# Patient Record
Sex: Male | Born: 1960 | Race: White | Hispanic: No | Marital: Married | Smoking: Never smoker
Health system: Southern US, Community
[De-identification: ages and names within clinical notes are randomized; demographics above are authoritative.]

## PROBLEM LIST (undated history)

## (undated) DIAGNOSIS — Z860100 Personal history of colon polyps, unspecified: Secondary | ICD-10-CM

## (undated) DIAGNOSIS — K4090 Unilateral inguinal hernia, without obstruction or gangrene, not specified as recurrent: Secondary | ICD-10-CM

## (undated) DIAGNOSIS — K219 Gastro-esophageal reflux disease without esophagitis: Secondary | ICD-10-CM

## (undated) DIAGNOSIS — R1310 Dysphagia, unspecified: Secondary | ICD-10-CM

## (undated) DIAGNOSIS — M1990 Unspecified osteoarthritis, unspecified site: Secondary | ICD-10-CM

## (undated) DIAGNOSIS — K5903 Drug induced constipation: Secondary | ICD-10-CM

## (undated) DIAGNOSIS — E785 Hyperlipidemia, unspecified: Secondary | ICD-10-CM

## (undated) HISTORY — DX: Personal history of colonic polyps: Z86.010

## (undated) HISTORY — PX: JOINT REPLACEMENT: SHX530

## (undated) HISTORY — PX: INGUINAL HERNIA REPAIR: SUR1180

## (undated) HISTORY — PX: HERNIA REPAIR: SHX51

## (undated) HISTORY — PX: KNEE ARTHROSCOPY: SUR90

## (undated) HISTORY — DX: Personal history of colon polyps, unspecified: Z86.0100

## (undated) HISTORY — DX: Hyperlipidemia, unspecified: E78.5

## (undated) HISTORY — PX: LEG SURGERY: SHX1003

## (undated) HISTORY — DX: Dysphagia, unspecified: R13.10

## (undated) HISTORY — PX: TOTAL HIP ARTHROPLASTY: SHX124

## (undated) HISTORY — DX: Unspecified osteoarthritis, unspecified site: M19.90

---

## 2012-03-29 ENCOUNTER — Encounter: Payer: Self-pay | Admitting: Internal Medicine

## 2012-04-21 ENCOUNTER — Ambulatory Visit: Payer: Self-pay | Admitting: Internal Medicine

## 2012-04-23 ENCOUNTER — Encounter: Payer: Self-pay | Admitting: Internal Medicine

## 2012-04-26 ENCOUNTER — Ambulatory Visit (INDEPENDENT_AMBULATORY_CARE_PROVIDER_SITE_OTHER): Payer: BC Managed Care – PPO | Admitting: Internal Medicine

## 2012-04-26 ENCOUNTER — Encounter: Payer: Self-pay | Admitting: Internal Medicine

## 2012-04-26 VITALS — BP 132/68 | HR 88 | Ht 69.0 in | Wt 201.0 lb

## 2012-04-26 DIAGNOSIS — Z860100 Personal history of colon polyps, unspecified: Secondary | ICD-10-CM

## 2012-04-26 DIAGNOSIS — Z800 Family history of malignant neoplasm of digestive organs: Secondary | ICD-10-CM | POA: Insufficient documentation

## 2012-04-26 DIAGNOSIS — E785 Hyperlipidemia, unspecified: Secondary | ICD-10-CM | POA: Insufficient documentation

## 2012-04-26 DIAGNOSIS — R1310 Dysphagia, unspecified: Secondary | ICD-10-CM

## 2012-04-26 DIAGNOSIS — R1314 Dysphagia, pharyngoesophageal phase: Secondary | ICD-10-CM

## 2012-04-26 MED ORDER — PEG-KCL-NACL-NASULF-NA ASC-C 100 G PO SOLR: 1.0000 | Freq: Once | ORAL | Status: DC

## 2012-04-26 NOTE — Progress Notes (Signed)
Patient ID: Darrell Welch, male   DOB: 10-24-1961, 51 y.o.   MRN: [id number]  SUBJECTIVE: HPI Darrell Welch is a 51 yo male with PMH of HL who is seen for evaluation of high risk screening colonoscopy. The patient reports a previous colonoscopy about 5 years ago, and he recalls a history of colon polyps. He feels that some of these polyps were precancerous, but knows no cancer was found in any polyp removed. He is currently doing very well without GI complaint, other than intermittent dysphagia. He reports this is worse with dry foods such as bread, and he occasionally has trouble with meats. He's had an upper endoscopy before, but no stricture was found. He does not recall if random biopsies were performed. His issues with esophageal dysphagia are intermittent, often mild, and very long-standing. Of note, his son who is 95 years old has ongoing trouble with dysphagia (he notes his son's symptoms are more severe than his). He has never had food impaction. He has no nausea or vomiting. Has not lost weight. His appetite is good. His bowel habits been regular. He has not had any blood in his stool nor melena. No change in his bowel habits or pattern. His sister did have colon cancer at age 43 and died of this as well. No fevers or chills. He has had some left hip pain, worse at night after lying down. This is currently being evaluated by orthopedics, and he had an MRI earlier today.  Review of Systems  As per history of present illness, otherwise negative   Past Medical History  Diagnosis Date  . Hyperlipemia   . Hx of colonic polyps     Colonoscopy- Dr. Clinton Gallant)    Current Outpatient Prescriptions  Medication Sig Dispense Refill  . meloxicam (MOBIC) 15 MG tablet Uses as directed      . peg 3350 powder (MOVIPREP) 100 G SOLR Take 1 kit (100 g total) by mouth once.  1 kit  0  . simvastatin (ZOCOR) 20 MG tablet Uses as directed        No Known Allergies  Family History  Problem Relation Age  of Onset  . Colon cancer Sister 30    History  Substance Use Topics  . Smoking status: Never Smoker   . Smokeless tobacco: Never Used  . Alcohol Use: No    OBJECTIVE: BP 132/68  Pulse 88  Ht 5\' 9"  (1.753 m)  Wt 201 lb (91.173 kg)  BMI 29.68 kg/m2 Constitutional: Well-developed and well-nourished. No distress. HEENT: Normocephalic and atraumatic. Oropharynx is clear and moist. No oropharyngeal exudate. Conjunctivae are normal. Pupils are equal round and reactive to light. No scleral icterus. Neck: Neck supple. Trachea midline. Cardiovascular: Normal rate, regular rhythm and intact distal pulses. No M/R/G Pulmonary/chest: Effort normal and breath sounds normal. No wheezing, rales or rhonchi. Abdominal: Soft, nontender, nondistended. Bowel sounds active throughout. There are no masses palpable. No hepatosplenomegaly. Extremities: no clubbing, cyanosis, or edema Lymphadenopathy: No cervical adenopathy noted. Neurological: Alert and oriented to person place and time. Skin: Skin is warm and dry. No rashes noted. Psychiatric: Normal mood and affect. Behavior is normal.  ASSESSMENT AND PLAN:  51 yo male with PMH of HL who is seen for evaluation of high risk screening colonoscopy, also with intermittent, and chronic esophageal dysphagia.  1. High risk colon cancer screening, family history of colon cancer in sister at age 58/history of colon polyps -- the patient has 2 reasons to have a repeat colonoscopy for screening/surveillance,  those being his personal history of colon polyps and his sister's history of colon cancer at age 71. We will obtain the records of his prior colonoscopy for completeness. We discussed colonoscopy including the risks and benefits and he is agreeable to proceed. His screening interval should be every 5 years based on his history and his sister's history.  2. Esophageal dysphagia -- the patient has had a previous endoscopy, and we will request these records as well.  If random biopsies were not performed, then I have suggested repeating the colonoscopy to rule out eosinophilic esophagitis. The patient's symptoms could be consistent with eosinophilic esophagitis, along with the fact that his son seems to suffer from the same condition. At present he is not on PPI, but not having heartburn. We can perform the EGD at the same time as his colonoscopy, assuming random biopsies have not already been performed. Again the risks and benefits of been discussed and he is agreeable to proceed.  Further recommendations after procedures

## 2012-04-26 NOTE — Patient Instructions (Addendum)
You have been scheduled for a colonoscopy with propofol. Please follow written instructions given to you at your visit today.  Please pick up your prep kit at the pharmacy within the next 1-3 days.   We have sent the following medications to your pharmacy for you to pick up at your convenience: Moviprep

## 2012-05-19 ENCOUNTER — Telehealth: Payer: Self-pay | Admitting: Internal Medicine

## 2012-05-19 NOTE — Telephone Encounter (Signed)
Returned pts call.  Spoke with his wife.  Answered questions regarding meds for tomorrow.  She verbalized understanding.

## 2012-05-20 ENCOUNTER — Ambulatory Visit (AMBULATORY_SURGERY_CENTER): Payer: BC Managed Care – PPO | Admitting: Internal Medicine

## 2012-05-20 ENCOUNTER — Encounter: Payer: Self-pay | Admitting: Internal Medicine

## 2012-05-20 VITALS — BP 135/86 | HR 76 | Temp 96.2°F | Resp 13 | Ht 69.0 in | Wt 201.0 lb

## 2012-05-20 DIAGNOSIS — Z800 Family history of malignant neoplasm of digestive organs: Secondary | ICD-10-CM

## 2012-05-20 DIAGNOSIS — Z1211 Encounter for screening for malignant neoplasm of colon: Secondary | ICD-10-CM

## 2012-05-20 DIAGNOSIS — D126 Benign neoplasm of colon, unspecified: Secondary | ICD-10-CM

## 2012-05-20 DIAGNOSIS — R1314 Dysphagia, pharyngoesophageal phase: Secondary | ICD-10-CM

## 2012-05-20 DIAGNOSIS — Z86010 Personal history of colonic polyps: Secondary | ICD-10-CM

## 2012-05-20 DIAGNOSIS — R1310 Dysphagia, unspecified: Secondary | ICD-10-CM

## 2012-05-20 DIAGNOSIS — K209 Esophagitis, unspecified: Secondary | ICD-10-CM

## 2012-05-20 MED ORDER — PANTOPRAZOLE SODIUM 40 MG PO TBEC: 40.0000 mg | DELAYED_RELEASE_TABLET | Freq: Every day | ORAL | Status: DC

## 2012-05-20 MED ORDER — SODIUM CHLORIDE 0.9 % IV SOLN: 500.0000 mL | INTRAVENOUS | Status: DC

## 2012-05-20 NOTE — Op Note (Signed)
[facility] [address] [place], [place]  [address]  COLONOSCOPY PROCEDURE REPORT  Welch:  Darrell Welch, Darrell Welch  MR#:  [id number] BIRTHDATE:  11/11/1960, 51 yrs. old  GENDER:  male ENDOSCOPIST:  Carie Caddy. Kendricks Reap, MD REF. BY:  Keturah Barre, M.D. PROCEDURE DATE:  05/20/2012 PROCEDURE:  Colonoscopy with snare polypectomy ASA CLASS:  Class II INDICATIONS:  Elevated Risk Screening, surveillance and high-risk screening, last colonoscopy 2007 MEDICATIONS:   MAC sedation, administered by CRNA, propofol (Diprivan) 140 mg IV  DESCRIPTION OF PROCEDURE:   After Darrell risks benefits and alternatives of Darrell procedure were thoroughly explained, informed consent was obtained.  Digital rectal exam was performed and revealed no rectal masses.   Darrell LB CF-H180AL [id number] endoscope was introduced through Darrell anus and advanced to Darrell terminal ileum which was intubated for a short distance, without limitations. Darrell quality of Darrell prep was good, using MoviPrep.  Darrell instrument was then slowly withdrawn as Darrell colon was fully examined. <<PROCEDUREIMAGES>> FINDINGS:  Darrell terminal ileum appeared normal.  A 5 mm sessile polyp was found in Darrell ascending colon. Polyp was snared without cautery. Retrieval was successful. snare polyp  Two sessile polyps, 4 - 6 mm were found in Darrell transverse colon. Polyps were snared without cautery. Retrieval was successful.  Retroflexed views in Darrell rectum revealed no abnormalities.  Darrell scope was then withdrawn from Darrell cecum and Darrell procedure completed.  COMPLICATIONS:  None ENDOSCOPIC IMPRESSION: 1) Normal terminal ileum 2) Sessile polyp in Darrell ascending colon. Removed and sent to pathology. 3) Two polyps in Darrell transverse colon. Removed and sent to pathology.  RECOMMENDATIONS: 1) Await pathology results 2) If Darrell polyps removed today are proven to be adenomatous (pre-cancerous) polyps, you will need a colonoscopy in 3 years. You will receive a letter within  1-2 weeks with Darrell results of your biopsy as well as final recommendations. Please call my office if you have not received a letter after 3 weeks.  If polyps today are hyperplastic, then you will need a repeat colonoscopy in 5 years given your personal history of adenomatous (pre-cancerous) polyps.  Carie Caddy. Rhea Belton, MD  CC:  Darrell Welch Keturah Barre, MD  n. Darrell Welch at 05/20/2012 03:30 PM  Darrell Welch, [phone]

## 2012-05-20 NOTE — Progress Notes (Signed)
Patient did not experience any of the following events: a burn prior to discharge; a fall within the facility; wrong site/side/patient/procedure/implant event; or a hospital transfer or hospital admission upon discharge from the facility. (G8907) Patient did not have preoperative order for IV antibiotic SSI prophylaxis. (G8918)  

## 2012-05-20 NOTE — Op Note (Signed)
[facility] [address] [place], [place]  [address]  ENDOSCOPY PROCEDURE REPORT  PATIENT:  Mustaf, Antonacci  MR#:  [id number] BIRTHDATE:  Oct 09, 1961, 51 yrs. old  GENDER:  male ENDOSCOPIST:  Carie Caddy. Karielle Davidow, MD  PROCEDURE DATE:  05/20/2012 PROCEDURE:  EGD with biopsy, 432[address] ASA CLASS:  Class II INDICATIONS:  dysphagia MEDICATIONS:   MAC sedation, administered by CRNA, propofol (Diprivan) 150 TOPICAL ANESTHETIC:  none  DESCRIPTION OF PROCEDURE:   After the risks benefits and alternatives of the procedure were thoroughly explained, informed consent was obtained.  The LB GIF-H180 [id number] endoscope was introduced through the mouth and advanced to the second portion of the duodenum, without limitations.  The instrument was slowly withdrawn as the mucosa was fully examined. <<PROCEDUREIMAGES>> Mild LA Grade A esophagitis was found at the gastroesophageal junction.  Trachealization was noted in the total esophagus, raising question of eosinophilic esophagitis. Random biopsies were obtained and sent to pathology.  A small hiatal hernia was found. The stomach was entered and closely examined. The antrum, angularis, and lesser curvature were well visualized, including a retroflexed view of the cardia and fundus. The stomach wall was normally distensable. The scope passed easily through the pylorus into the duodenum.  The duodenal bulb was normal in appearance, as was the postbulbar duodenum.    Retroflexed views revealed a hiatal hernia.    The scope was then withdrawn from the patient and the procedure completed.  COMPLICATIONS:  None ENDOSCOPIC IMPRESSION: 1) Mild esophagitis at the gastroesophageal junction 2) Trachealization in the total esophagus.  Random biopsies performed and sent to pathology. 3) Small hiatal hernia 4) Normal stomach 5) Normal duodenum  RECOMMENDATIONS: 1) Await pathology results 2) Pantoprazole 40 mg daily for esophageal inflammation  Carie Caddy.  Rhea Belton, MD  CC:  The Patient Keturah Barre, MD  n. Rosalie DoctorCarie Caddy. Caryn Gienger at 05/20/2012 03:23 PM  Michel Harrow, [phone]

## 2012-05-20 NOTE — Patient Instructions (Addendum)

## 2012-05-21 ENCOUNTER — Telehealth: Payer: Self-pay | Admitting: *Deleted

## 2012-05-21 NOTE — Telephone Encounter (Signed)
  Follow up Call-  Call back number 05/20/2012  Post procedure Call Back phone  # [phone]  Permission to leave phone message Yes     Patient questions:  Do you have a fever, pain , or abdominal swelling? no Pain Score  0 *  Have you tolerated food without any problems? yes  Have you been able to return to your normal activities? yes  Do you have any questions about your discharge instructions: Diet   no Medications  no Follow up visit  no  Do you have questions or concerns about your Care? no  Actions: * If pain score is 4 or above: No action needed, pain <4.

## 2012-05-27 ENCOUNTER — Encounter: Payer: Self-pay | Admitting: Internal Medicine

## 2012-08-26 ENCOUNTER — Telehealth (INDEPENDENT_AMBULATORY_CARE_PROVIDER_SITE_OTHER): Payer: Self-pay

## 2012-08-26 NOTE — Telephone Encounter (Signed)
Pt has appt 09-13-12 for eval hernia. Wife states hernia will reduce and become soft when pt rest but when he is up it is firm and painful. Pt requests sooner appt. I advised her will send msg to Dr Jacinto Halim assistant to move appt up if possible. I reviewed signs of incarceration and if this occurs pt needs to go to ER asap. She states she understands.

## 2012-09-03 ENCOUNTER — Encounter (INDEPENDENT_AMBULATORY_CARE_PROVIDER_SITE_OTHER): Payer: Self-pay | Admitting: General Surgery

## 2012-09-06 ENCOUNTER — Ambulatory Visit (INDEPENDENT_AMBULATORY_CARE_PROVIDER_SITE_OTHER): Payer: BC Managed Care – PPO | Admitting: General Surgery

## 2012-09-06 ENCOUNTER — Encounter (INDEPENDENT_AMBULATORY_CARE_PROVIDER_SITE_OTHER): Payer: Self-pay | Admitting: General Surgery

## 2012-09-06 VITALS — BP 124/82 | HR 60 | Temp 98.0°F | Resp 18 | Ht 69.0 in | Wt 193.8 lb

## 2012-09-06 DIAGNOSIS — K4090 Unilateral inguinal hernia, without obstruction or gangrene, not specified as recurrent: Secondary | ICD-10-CM

## 2012-09-06 NOTE — Progress Notes (Signed)
Subjective:     Patient ID: Darrell Welch, male   DOB: August 27, 1961, 51 y.o.   MRN: [id number]  HPI We're asked to see the patient in consultation by Dr. Sherral Hammers to evaluate him for a right inguinal hernia. 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 During his rehabilitation he feels as though he has developed a hernia in his right groin. With activity he'll developed a large bulge in the right groin that reduces easily. He's had some significant tenderness on the right. He has also developed some tenderness on the left side but has not noticed a bulge. He does have a history of a laparoscopic left inguinal hernia repair with mesh and aspirate about 5 years ago. He denies any nausea or vomiting. He denies any chest pain or shortness of breath.  Review of Systems  Constitutional: Negative.   HENT: Negative.   Eyes: Negative.   Respiratory: Negative.   Cardiovascular: Negative.   Gastrointestinal: Negative.   Genitourinary: Negative.   Musculoskeletal: Negative.   Skin: Negative.   Neurological: Negative.   Hematological: Negative.   Psychiatric/Behavioral: Negative.        Objective:   Physical Exam  Constitutional: He is oriented to person, place, and time. He appears well-developed and well-nourished.  HENT:  Head: Normocephalic and atraumatic.  Eyes: Conjunctivae normal and EOM are normal. Pupils are equal, round, and reactive to light.  Neck: Normal range of motion. Neck supple.  Cardiovascular: Normal rate, regular rhythm and normal heart sounds.   Pulmonary/Chest: Effort normal and breath sounds normal.  Abdominal: Soft. Bowel sounds are normal.  Genitourinary:       The patient has significant tenderness to palpation of the right groin. At this point in time it is difficult for me to palpate any impulse with straining or reducible bulge although his history is highly suggestive of a right inguinal hernia. There is no palpable  impulse with straining or fullness in the left groin.  Musculoskeletal: Normal range of motion.  Neurological: He is alert and oriented to person, place, and time.  Skin: Skin is warm and dry.  Psychiatric: He has a normal mood and affect. His behavior is normal.       Assessment:     The patient has what sounds like a symptomatic right inguinal hernia. It is difficult to appreciate on physical exam today. Because of this we will plan to get a limited CT of the pelvis. This will also help [place] evaluate his previous repair on the left.    Plan:     If the CT is positive then we will plan for a right inguinal hernia repair with mesh.

## 2012-09-06 NOTE — Patient Instructions (Signed)
Plan for right inguinal hernia repair with mesh if ct is positive

## 2012-09-08 ENCOUNTER — Other Ambulatory Visit (INDEPENDENT_AMBULATORY_CARE_PROVIDER_SITE_OTHER): Payer: Self-pay | Admitting: General Surgery

## 2012-09-08 ENCOUNTER — Encounter (HOSPITAL_BASED_OUTPATIENT_CLINIC_OR_DEPARTMENT_OTHER): Payer: Self-pay | Admitting: *Deleted

## 2012-09-08 ENCOUNTER — Ambulatory Visit
Admission: RE | Admit: 2012-09-08 | Discharge: 2012-09-08 | Disposition: A | Discharge status: Home / Self Care | Payer: BC Managed Care – PPO | Source: Ambulatory Visit | Attending: General Surgery | Admitting: General Surgery

## 2012-09-08 DIAGNOSIS — K4090 Unilateral inguinal hernia, without obstruction or gangrene, not specified as recurrent: Secondary | ICD-10-CM

## 2012-09-08 MED ORDER — IOHEXOL 300 MG/ML  SOLN
100.0000 mL | Freq: Once | INTRAMUSCULAR | Status: AC | PRN
  Administered 2012-09-08: 100 mL via INTRAVENOUS

## 2012-09-08 NOTE — Progress Notes (Signed)
tammy talked with pt while computers were down

## 2012-09-09 ENCOUNTER — Telehealth (INDEPENDENT_AMBULATORY_CARE_PROVIDER_SITE_OTHER): Payer: Self-pay | Admitting: General Surgery

## 2012-09-09 NOTE — Telephone Encounter (Signed)
Message copied by Liliana Cline on Thu Sep 09, 2012  9:22 AM ------      Message from: Caleen Essex III      Created: Wed Sep 08, 2012  6:16 PM       He has right inguinal hernia. Can't see left very well. i will put orders in for the right

## 2012-09-09 NOTE — Telephone Encounter (Signed)
Patient aware the CT showed hernia on the right. He is set up for surgery tomorrow.

## 2012-09-10 ENCOUNTER — Ambulatory Visit (HOSPITAL_BASED_OUTPATIENT_CLINIC_OR_DEPARTMENT_OTHER): Payer: BC Managed Care – PPO | Admitting: Anesthesiology

## 2012-09-10 ENCOUNTER — Ambulatory Visit (HOSPITAL_BASED_OUTPATIENT_CLINIC_OR_DEPARTMENT_OTHER)
Admission: RE | Admit: 2012-09-10 | Discharge: 2012-09-10 | Disposition: A | Discharge status: Home / Self Care | Payer: BC Managed Care – PPO | Source: Ambulatory Visit | Attending: General Surgery | Admitting: General Surgery

## 2012-09-10 ENCOUNTER — Encounter (HOSPITAL_BASED_OUTPATIENT_CLINIC_OR_DEPARTMENT_OTHER): Payer: Self-pay | Admitting: Anesthesiology

## 2012-09-10 ENCOUNTER — Encounter (HOSPITAL_BASED_OUTPATIENT_CLINIC_OR_DEPARTMENT_OTHER)
Admission: RE | Disposition: A | Discharge status: Home / Self Care | Payer: Self-pay | Source: Ambulatory Visit | Attending: General Surgery

## 2012-09-10 ENCOUNTER — Encounter (HOSPITAL_BASED_OUTPATIENT_CLINIC_OR_DEPARTMENT_OTHER): Payer: Self-pay | Admitting: *Deleted

## 2012-09-10 DIAGNOSIS — K4090 Unilateral inguinal hernia, without obstruction or gangrene, not specified as recurrent: Secondary | ICD-10-CM | POA: Insufficient documentation

## 2012-09-10 DIAGNOSIS — K219 Gastro-esophageal reflux disease without esophagitis: Secondary | ICD-10-CM | POA: Insufficient documentation

## 2012-09-10 HISTORY — PX: INSERTION OF MESH: SHX5868

## 2012-09-10 HISTORY — PX: INGUINAL HERNIA REPAIR: SHX194

## 2012-09-10 HISTORY — DX: Gastro-esophageal reflux disease without esophagitis: K21.9

## 2012-09-10 HISTORY — DX: Unilateral inguinal hernia, without obstruction or gangrene, not specified as recurrent: K40.90

## 2012-09-10 HISTORY — DX: Drug induced constipation: K59.03

## 2012-09-10 SURGERY — REPAIR, HERNIA, INGUINAL, ADULT
Anesthesia: General | Site: Groin | Laterality: Right | Wound class: Clean

## 2012-09-10 MED ORDER — LACTATED RINGERS IV SOLN
INTRAVENOUS | Status: DC
  Administered 2012-09-10: 11:00:00 via INTRAVENOUS
  Administered 2012-09-10: 10 mL/h via INTRAVENOUS
  Administered 2012-09-10: 10:00:00 via INTRAVENOUS

## 2012-09-10 MED ORDER — HYDROMORPHONE HCL PF 1 MG/ML IJ SOLN
0.2500 mg | INTRAMUSCULAR | Status: DC | PRN
  Administered 2012-09-10 (×2): 0.5 mg via INTRAVENOUS

## 2012-09-10 MED ORDER — MIDAZOLAM HCL 5 MG/5ML IJ SOLN
INTRAMUSCULAR | Status: DC | PRN
  Administered 2012-09-10: 2 mg via INTRAVENOUS

## 2012-09-10 MED ORDER — BUPIVACAINE-EPINEPHRINE 0.25% -1:200000 IJ SOLN
INTRAMUSCULAR | Status: DC | PRN
  Administered 2012-09-10: 30 mL

## 2012-09-10 MED ORDER — OXYCODONE HCL 5 MG PO TABS: 5.0000 mg | ORAL_TABLET | Freq: Once | ORAL | Status: DC | PRN

## 2012-09-10 MED ORDER — CHLORHEXIDINE GLUCONATE 4 % EX LIQD: 1.0000 "application " | Freq: Once | CUTANEOUS | Status: DC

## 2012-09-10 MED ORDER — DEXAMETHASONE SODIUM PHOSPHATE 4 MG/ML IJ SOLN
INTRAMUSCULAR | Status: DC | PRN
  Administered 2012-09-10: 10 mg via INTRAVENOUS

## 2012-09-10 MED ORDER — CEFAZOLIN SODIUM-DEXTROSE 2-3 GM-% IV SOLR: 2.0000 g | INTRAVENOUS | Status: DC

## 2012-09-10 MED ORDER — FENTANYL CITRATE 0.05 MG/ML IJ SOLN
INTRAMUSCULAR | Status: DC | PRN
  Administered 2012-09-10 (×4): 50 ug via INTRAVENOUS

## 2012-09-10 MED ORDER — PROPOFOL 10 MG/ML IV BOLUS
INTRAVENOUS | Status: DC | PRN
  Administered 2012-09-10: 200 mg via INTRAVENOUS

## 2012-09-10 MED ORDER — PROMETHAZINE HCL 25 MG/ML IJ SOLN: 6.2500 mg | INTRAMUSCULAR | Status: DC | PRN

## 2012-09-10 MED ORDER — CEFAZOLIN SODIUM-DEXTROSE 2-3 GM-% IV SOLR
2.0000 g | INTRAVENOUS | Status: AC
  Administered 2012-09-10: 2 g via INTRAVENOUS

## 2012-09-10 MED ORDER — HYDROCODONE-ACETAMINOPHEN 5-325 MG PO TABS: 1.0000 | ORAL_TABLET | Freq: Four times a day (QID) | ORAL | Status: DC | PRN

## 2012-09-10 MED ORDER — ONDANSETRON HCL 4 MG/2ML IJ SOLN
INTRAMUSCULAR | Status: DC | PRN
  Administered 2012-09-10: 4 mg via INTRAVENOUS

## 2012-09-10 MED ORDER — LIDOCAINE HCL (CARDIAC) 20 MG/ML IV SOLN
INTRAVENOUS | Status: DC | PRN
  Administered 2012-09-10: 100 mg via INTRAVENOUS

## 2012-09-10 MED ORDER — OXYCODONE HCL 5 MG/5ML PO SOLN: 5.0000 mg | Freq: Once | ORAL | Status: DC | PRN

## 2012-09-10 SURGICAL SUPPLY — 45 items
BENZOIN TINCTURE PRP APPL 2/3 (GAUZE/BANDAGES/DRESSINGS) IMPLANT
BLADE HEX COATED 2.75 (ELECTRODE) ×2 IMPLANT
BLADE SURG 15 STRL LF DISP TIS (BLADE) ×1 IMPLANT
BLADE SURG 15 STRL SS (BLADE) ×1
BLADE SURG ROTATE 9660 (MISCELLANEOUS) ×2 IMPLANT
CHLORAPREP W/TINT 26ML (MISCELLANEOUS) ×2 IMPLANT
CLOTH BEACON ORANGE TIMEOUT ST (SAFETY) ×2 IMPLANT
COVER MAYO STAND STRL (DRAPES) ×2 IMPLANT
COVER TABLE BACK 60X90 (DRAPES) ×2 IMPLANT
DECANTER SPIKE VIAL GLASS SM (MISCELLANEOUS) IMPLANT
DERMABOND ADVANCED (GAUZE/BANDAGES/DRESSINGS) ×1
DERMABOND ADVANCED .7 DNX12 (GAUZE/BANDAGES/DRESSINGS) ×1 IMPLANT
DRAIN PENROSE 1/2X12 LTX STRL (WOUND CARE) ×2 IMPLANT
DRAPE LAPAROTOMY TRNSV 102X78 (DRAPE) ×2 IMPLANT
DRAPE UTILITY XL STRL (DRAPES) ×2 IMPLANT
ELECT REM PT RETURN 9FT ADLT (ELECTROSURGICAL) ×2
ELECTRODE REM PT RTRN 9FT ADLT (ELECTROSURGICAL) ×1 IMPLANT
GLOVE BIO SURGEON STRL SZ7.5 (GLOVE) ×2 IMPLANT
GLOVE ECLIPSE 6.5 STRL STRAW (GLOVE) ×2 IMPLANT
GOWN PREVENTION PLUS XLARGE (GOWN DISPOSABLE) ×4 IMPLANT
MESH ULTRAPRO 3X6 7.6X15CM (Mesh General) ×2 IMPLANT
NEEDLE HYPO 22GX1.5 SAFETY (NEEDLE) IMPLANT
NEEDLE HYPO 25X1 1.5 SAFETY (NEEDLE) ×2 IMPLANT
NS IRRIG 1000ML POUR BTL (IV SOLUTION) ×2 IMPLANT
PACK BASIN DAY SURGERY FS (CUSTOM PROCEDURE TRAY) ×2 IMPLANT
PAIN PUMP ON-Q 100MLX2ML 2.5IN (PAIN MANAGEMENT) IMPLANT
PENCIL BUTTON HOLSTER BLD 10FT (ELECTRODE) ×2 IMPLANT
SLEEVE SCD COMPRESS KNEE MED (MISCELLANEOUS) ×2 IMPLANT
SPONGE LAP 18X18 X RAY DECT (DISPOSABLE) ×2 IMPLANT
STRIP CLOSURE SKIN 1/2X4 (GAUZE/BANDAGES/DRESSINGS) IMPLANT
SUT MON AB 4-0 PC3 18 (SUTURE) ×2 IMPLANT
SUT PROLENE 2 0 SH DA (SUTURE) ×4 IMPLANT
SUT SILK 2 0 SH (SUTURE) ×2 IMPLANT
SUT SILK 3 0 SH 30 (SUTURE) IMPLANT
SUT SILK 3 0 TIES 17X18 (SUTURE) ×1
SUT SILK 3-0 18XBRD TIE BLK (SUTURE) ×1 IMPLANT
SUT VIC AB 0 SH 27 (SUTURE) ×2 IMPLANT
SUT VIC AB 2-0 SH 27 (SUTURE) ×1
SUT VIC AB 2-0 SH 27XBRD (SUTURE) ×1 IMPLANT
SUT VIC AB 3-0 SH 27 (SUTURE) ×1
SUT VIC AB 3-0 SH 27X BRD (SUTURE) ×1 IMPLANT
SYR CONTROL 10ML LL (SYRINGE) ×2 IMPLANT
TOWEL OR 17X24 6PK STRL BLUE (TOWEL DISPOSABLE) ×2 IMPLANT
TOWEL OR NON WOVEN STRL DISP B (DISPOSABLE) IMPLANT
WATER STERILE IRR 1000ML POUR (IV SOLUTION) IMPLANT

## 2012-09-10 NOTE — Transfer of Care (Signed)
Immediate Anesthesia Transfer of Care Note  Patient: Darrell Welch  Procedure(s) Performed: Procedure(s) (LRB) with comments: HERNIA REPAIR INGUINAL ADULT (Right) INSERTION OF MESH (Right) - right inguinal hernia repair  Patient Location: PACU  Anesthesia Type:General  Level of Consciousness: sedated  Airway & Oxygen Therapy: Patient Spontanous Breathing and Patient connected to face mask oxygen  Post-op Assessment: Report given to PACU RN and Post -op Vital signs reviewed and stable  Post vital signs: Reviewed and stable  Complications: No apparent anesthesia complications

## 2012-09-10 NOTE — Op Note (Signed)
09/10/2012  11:47 AM  PATIENT:  Darrell Welch  51 y.o. male  PRE-OPERATIVE DIAGNOSIS:  right inguinal hernia  POST-OPERATIVE DIAGNOSIS:  right inguinal hernia  PROCEDURE:  Procedure(s) (LRB) with comments: HERNIA REPAIR INGUINAL ADULT (Right) INSERTION OF MESH (Right) - right inguinal hernia repair  SURGEON:  Surgeon(s) and Role:    * Robyne Askew, MD - Primary  PHYSICIAN ASSISTANT:   ASSISTANTS: none   ANESTHESIA:   general  EBL:  Total I/O In: 1000 [I.V.:1000] Out: -   BLOOD ADMINISTERED:none  DRAINS: none   LOCAL MEDICATIONS USED:  MARCAINE     SPECIMEN:  No Specimen  DISPOSITION OF SPECIMEN:  N/A  COUNTS:  YES  TOURNIQUET:  * No tourniquets in log *  DICTATION: .Dragon Dictation After informed consent was obtained the patient was brought to the operating room and placed in the supine position on the operating room table. After adequate induction of general anesthesia the patient's abdomen and right groin were prepped with ChloraPrep, allowed to dry, and draped in usual sterile manner. The right groin area was then infiltrated with quarter percent Marcaine. A small incision was made from the edge of the pubic tubercle on the right towards the anterior superior iliac spine. This incision was carried through the skin and subcutaneous tissue sharply with the electrocautery until the fascia of the external oblique was encountered. A small bridging vein was clamped with hemostats divided and ligated with 3-0 silk ties. The external oblique fascia was opened along its fibers towards the apex of the external ring with a 15 blade knife and Metzenbaum scissors. A wheatlan retractor was deployed. Blunt dissection was carried out of the cord structures until they could be surrounded between 2 fingers. A half-inch Penrose drain was placed around the cord structures for retraction purposes. The ilioinguinal nerve was identified and vault with some scar tissue. It was dissected free  proximally and distally clamped with hemostats, and divided and ligated with 3-0 silk ties. The cord structures were then gently skeletonized by combination of blunt hemostat dissection and some sharp dissection with the electrocautery. A small hernia sac was identified. It was gently separated from the rest of the cord structures. Care was taken to preserve the testicular artery and vas deferens. The sac was so small that it reduced easily. We repaired the floor of the canal with an interrupted 0 Vicryl stitch. A 3 x 6 piece of ultra pro mesh was then chosen and cut to fit. The mesh was sewed inferiorly to the shelving edge of the inguinal ligament with a running 2-0 Prolene stitch. Tails were cut in the mesh laterally and the tails were wrapped around the cord structures. Superiorly the mesh was sewed to the musculoaponeurotic strength layer of the transversalis with 2-0 Prolene vertical mattress stitches. Lateral to the cord the tails of the mesh were anchored to the shelving edge of the inguinal ligament with an interrupted 2-0 Prolene stitch. Once this was accomplished the mesh appeared to be in good position and the hernia appeared to be well repaired. The wound was irrigated with copious amounts of saline. The external oblique fascia was reapproximated with a running 2-0 Vicryl stitch. The wound was then infiltrated with 1/4% Marcaine. The subcutaneous fascia was closed with a running 3-0 Vicryl stitch. The skin was then closed around and 4 Monocryl subcuticular stitch. Dermabond dressings were applied. The patient tolerated the procedure well. At the end of the case needle sponge and instrument counts were  correct. The patient was then awakened and taken to recovery in stable condition. The patient's testicles in the scrotum at the end of the case.  PLAN OF CARE: Discharge to home after PACU  PATIENT DISPOSITION:  PACU - hemodynamically stable.   Delay start of Pharmacological VTE agent (>24hrs) due to  surgical blood loss or risk of bleeding: not applicable

## 2012-09-10 NOTE — Interval H&P Note (Signed)
History and Physical Interval Note:  09/10/2012 10:01 AM  Darrell Welch Darrell Welch  has presented today for surgery, with the diagnosis of right inguinal hernia  The various methods of treatment have been discussed with the patient and family. After consideration of risks, benefits and other options for treatment, the patient has consented to  Procedure(s) (LRB) with comments: HERNIA REPAIR INGUINAL ADULT (Right) INSERTION OF MESH (N/A) as a surgical intervention .  The patient's history has been reviewed, patient examined, no change in status, stable for surgery.  I have reviewed the patient's chart and labs.  Questions were answered to the patient's satisfaction.     TOTH III,Laporche Martelle S

## 2012-09-10 NOTE — Anesthesia Postprocedure Evaluation (Signed)
  Anesthesia Post-op Note  Patient: Darrell Welch  Procedure(s) Performed: Procedure(s) (LRB) with comments: HERNIA REPAIR INGUINAL ADULT (Right) INSERTION OF MESH (Right) - right inguinal hernia repair  Patient Location: PACU  Anesthesia Type:General  Level of Consciousness: awake  Airway and Oxygen Therapy: Patient Spontanous Breathing  Post-op Pain: mild  Post-op Assessment: Post-op Vital signs reviewed, Patient's Cardiovascular Status Stable, Respiratory Function Stable, Patent Airway, No signs of Nausea or vomiting and Pain level controlled  Post-op Vital Signs: stable  Complications: No apparent anesthesia complications

## 2012-09-10 NOTE — Anesthesia Procedure Notes (Signed)
Procedure Name: LMA Insertion Date/Time: 09/10/2012 10:30 AM Performed by: Gar Gibbon Pre-anesthesia Checklist: Patient identified, Emergency Drugs available, Suction available and Patient being monitored Patient Re-evaluated:Patient Re-evaluated prior to inductionOxygen Delivery Method: Circle System Utilized Preoxygenation: Pre-oxygenation with 100% oxygen Intubation Type: IV induction Ventilation: Mask ventilation without difficulty LMA: LMA inserted LMA Size: 5.0 Number of attempts: 1 Airway Equipment and Method: bite block Placement Confirmation: positive ETCO2 Tube secured with: Tape Dental Injury: Teeth and Oropharynx as per pre-operative assessment

## 2012-09-10 NOTE — Anesthesia Preprocedure Evaluation (Signed)
Anesthesia Evaluation  Patient identified by MRN, date of birth, ID band Patient awake    Reviewed: Allergy & Precautions, H&P , NPO status , Patient's Chart, lab work & pertinent test results  History of Anesthesia Complications Negative for: history of anesthetic complications  Airway Mallampati: II TM Distance: >3 FB Neck ROM: Full    Dental  (+) Teeth Intact and Dental Advisory Given   Pulmonary neg pulmonary ROS,    Pulmonary exam normal       Cardiovascular negative cardio ROS      Neuro/Psych negative neurological ROS     GI/Hepatic Neg liver ROS, GERD-  Controlled,  Endo/Other  negative endocrine ROS  Renal/GU negative Renal ROS     Musculoskeletal   Abdominal   Peds  Hematology   Anesthesia Other Findings   Reproductive/Obstetrics                           Anesthesia Physical Anesthesia Plan  ASA: I  Anesthesia Plan: General   Post-op Pain Management:    Induction: Intravenous  Airway Management Planned: LMA  Additional Equipment:   Intra-op Plan:   Post-operative Plan: Extubation in OR  Informed Consent: I have reviewed the patients History and Physical, chart, labs and discussed the procedure including the risks, benefits and alternatives for the proposed anesthesia with the patient or authorized representative who has indicated his/her understanding and acceptance.   Dental advisory given  Plan Discussed with: CRNA, Anesthesiologist and Surgeon  Anesthesia Plan Comments:         Anesthesia Quick Evaluation

## 2012-09-10 NOTE — H&P (View-Only) (Signed)
Subjective:     Patient ID: Darrell Welch, male   DOB: 08/03/1961, 51 y.o.   MRN: [id number]  HPI We're asked to see the patient in consultation by Dr. Robbins to evaluate him for a right inguinal hernia. 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 During his rehabilitation he feels as though he has developed a hernia in his right groin. With activity he'll developed a large bulge in the right groin that reduces easily. He's had some significant tenderness on the right. He has also developed some tenderness on the left side but has not noticed a bulge. He does have a history of a laparoscopic left inguinal hernia repair with mesh and aspirate about 5 years ago. He denies any nausea or vomiting. He denies any chest pain or shortness of breath.  Review of Systems  Constitutional: Negative.   HENT: Negative.   Eyes: Negative.   Respiratory: Negative.   Cardiovascular: Negative.   Gastrointestinal: Negative.   Genitourinary: Negative.   Musculoskeletal: Negative.   Skin: Negative.   Neurological: Negative.   Hematological: Negative.   Psychiatric/Behavioral: Negative.        Objective:   Physical Exam  Constitutional: He is oriented to person, place, and time. He appears well-developed and well-nourished.  HENT:  Head: Normocephalic and atraumatic.  Eyes: Conjunctivae normal and EOM are normal. Pupils are equal, round, and reactive to light.  Neck: Normal range of motion. Neck supple.  Cardiovascular: Normal rate, regular rhythm and normal heart sounds.   Pulmonary/Chest: Effort normal and breath sounds normal.  Abdominal: Soft. Bowel sounds are normal.  Genitourinary:       The patient has significant tenderness to palpation of the right groin. At this point in time it is difficult for me to palpate any impulse with straining or reducible bulge although his history is highly suggestive of a right inguinal hernia. There is no palpable  impulse with straining or fullness in the left groin.  Musculoskeletal: Normal range of motion.  Neurological: He is alert and oriented to person, place, and time.  Skin: Skin is warm and dry.  Psychiatric: He has a normal mood and affect. His behavior is normal.       Assessment:     The patient has what sounds like a symptomatic right inguinal hernia. It is difficult to appreciate on physical exam today. Because of this we will plan to get a limited CT of the pelvis. This will also help us evaluate his previous repair on the left.    Plan:     If the CT is positive then we will plan for a right inguinal hernia repair with mesh.      

## 2012-09-13 ENCOUNTER — Ambulatory Visit (INDEPENDENT_AMBULATORY_CARE_PROVIDER_SITE_OTHER): Payer: BC Managed Care – PPO | Admitting: General Surgery

## 2012-09-13 ENCOUNTER — Encounter (HOSPITAL_BASED_OUTPATIENT_CLINIC_OR_DEPARTMENT_OTHER): Payer: Self-pay | Admitting: General Surgery

## 2012-09-30 ENCOUNTER — Ambulatory Visit (INDEPENDENT_AMBULATORY_CARE_PROVIDER_SITE_OTHER): Payer: BC Managed Care – PPO | Admitting: General Surgery

## 2012-09-30 ENCOUNTER — Encounter (INDEPENDENT_AMBULATORY_CARE_PROVIDER_SITE_OTHER): Payer: Self-pay | Admitting: General Surgery

## 2012-09-30 VITALS — BP 130/74 | HR 86 | Temp 97.6°F | Resp 18 | Ht 69.0 in | Wt 198.8 lb

## 2012-09-30 DIAGNOSIS — K4090 Unilateral inguinal hernia, without obstruction or gangrene, not specified as recurrent: Secondary | ICD-10-CM

## 2012-09-30 NOTE — Progress Notes (Signed)
Subjective:     Patient ID: FABIANO GINLEY, male   DOB: 1961/05/21, 51 y.o.   MRN: [id number]  HPI The patient is a 51 year old white male who is 3 weeks status post right inguinal hernia repair with mesh. His discomfort is gradually improving. His appetite is good his bowels are working normally.  Review of Systems     Objective:   Physical Exam On exam his abdomen is soft and nontender. His right groin incision is healing nicely with no sign of infection. He has a normal healing ridge. There is no palpable evidence for recurrence of the hernia.    Assessment:     3 weeks status post right inguinal hernia repair with mesh    Plan:     At this point I would like him to continue to refrain from lifting anything heavy. We will plan to see him back in 3 weeks to check his progress.

## 2012-09-30 NOTE — Patient Instructions (Signed)
No heavy lifting for another 3 weeks 

## 2012-10-19 ENCOUNTER — Encounter (INDEPENDENT_AMBULATORY_CARE_PROVIDER_SITE_OTHER): Payer: Self-pay | Admitting: General Surgery

## 2012-10-19 ENCOUNTER — Ambulatory Visit (INDEPENDENT_AMBULATORY_CARE_PROVIDER_SITE_OTHER): Payer: BC Managed Care – PPO | Admitting: General Surgery

## 2012-10-19 VITALS — BP 118/70 | HR 80 | Temp 97.5°F | Resp 16 | Ht 69.0 in | Wt 204.6 lb

## 2012-10-19 DIAGNOSIS — K4090 Unilateral inguinal hernia, without obstruction or gangrene, not specified as recurrent: Secondary | ICD-10-CM

## 2012-10-19 NOTE — Progress Notes (Signed)
Subjective:     Patient ID: Darrell Welch, male   DOB: 1961/04/16, 51 y.o.   MRN: [id number]  HPI The patient is a 51 year old white male who is about 6 weeks status post right inguinal hernia repair with mesh. He still has some minor soreness associated with it but overall feels well. His appetite is good and his bowels are working normally.  Review of Systems     Objective:   Physical Exam On exam his abdomen is soft and nontender. His right groin incision is healing nicely with no sign of infection. There is no palpable evidence for recurrence of the hernia.    Assessment:     6 weeks status post right inguinal hernia repair with mesh    Plan:     At this point I believe he can return to his normal activities without any restrictions. We will plan to see him back on a when necessary basis

## 2012-10-19 NOTE — Patient Instructions (Signed)
May return to normal activities without restriction 

## 2013-03-17 IMAGING — CT CT PELVIS W/ CM
3 series · 13 of 36 positions shown, 19 images · IV contrast (READICAT & [ID] OMNI 300)
Comparison: Plain film of 07/13/2012.  MRI of the hip of
04/26/2012.

CLINICAL DATA: Bilateral inguinal pain, right greater than left.
Symptoms for 4 weeks.  Rule out inguinal hernias.

CT  PELVIS WITH CONTRAST
TECHNIQUE: Multidetector CT imaging of the pelvis was performed
following the standard protocol following the bolus administration
of intravenous contrast.
Contrast: 100  ml [id number]

[Series 3: routine pelvis · axial · 0.70mm/px · z∈[-212,-52]mm · 6 of 46 slices shown, 11 images]
[im 7/46  soft-tissue]
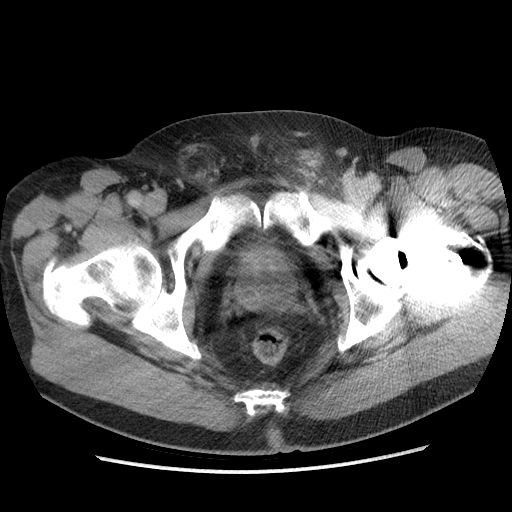
[im 7/46  bone]
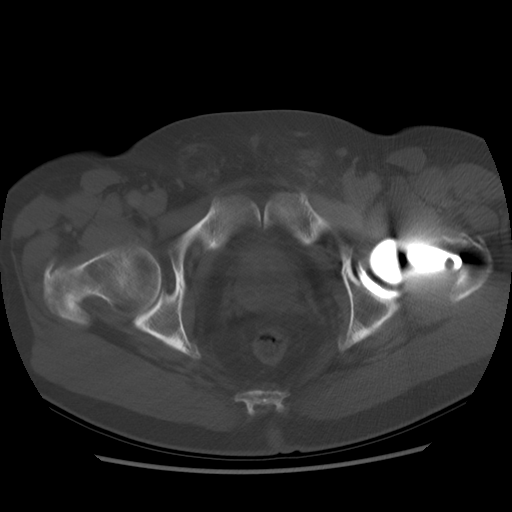
[im 13/46  soft-tissue]
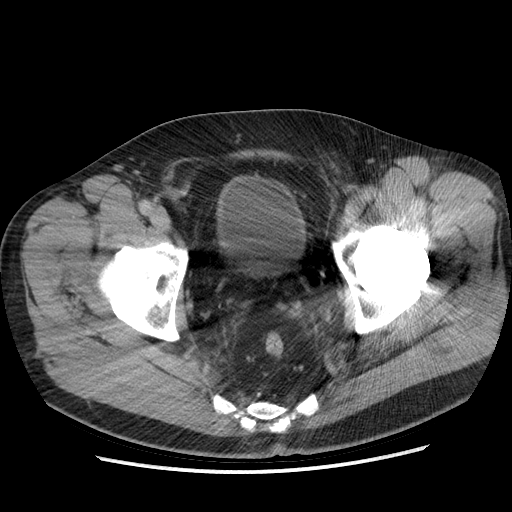
[im 20/46  soft-tissue]
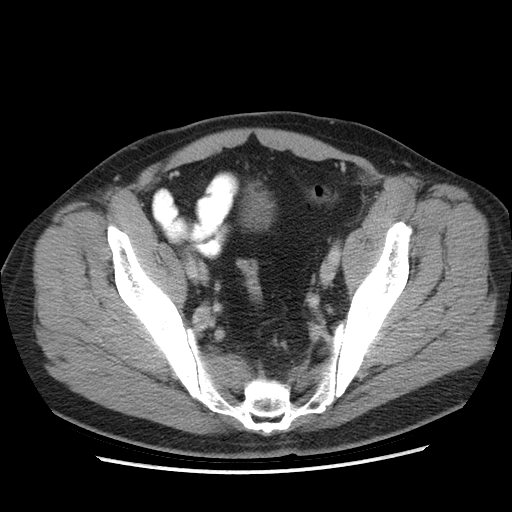
[im 20/46  lung]
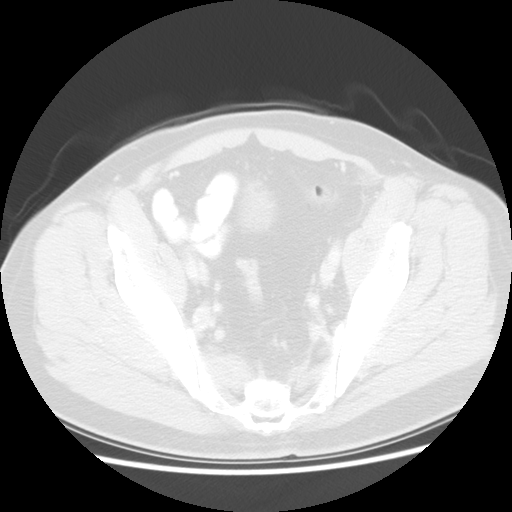
[im 26/46  soft-tissue]
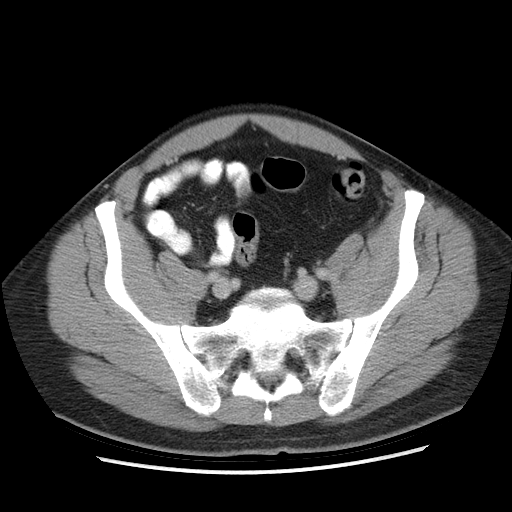
[im 26/46  lung]
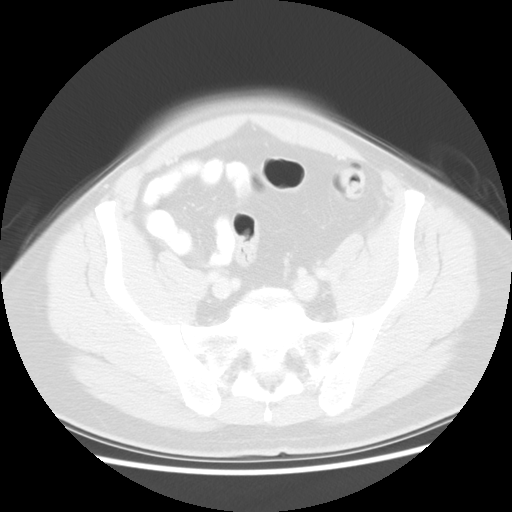
[im 33/46  soft-tissue]
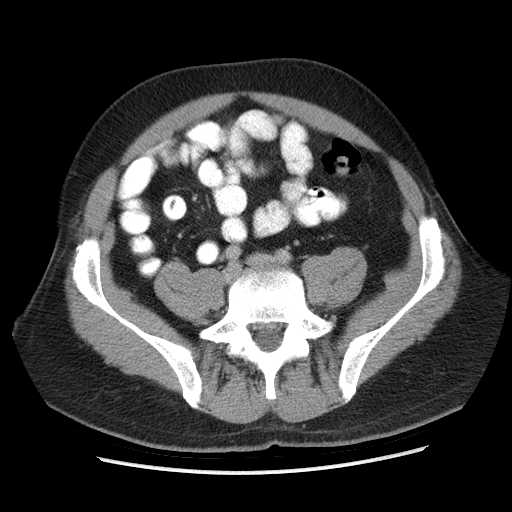
[im 33/46  lung]
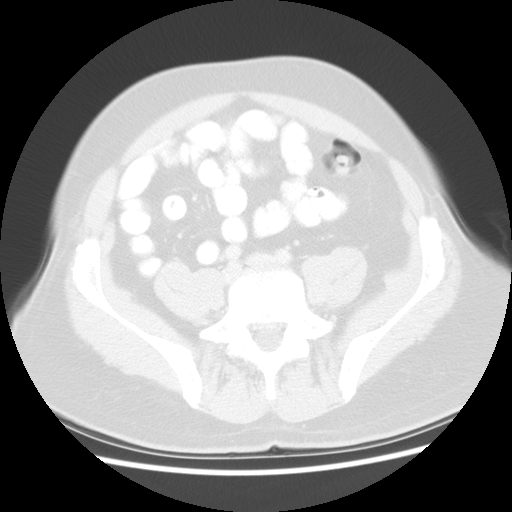
[im 39/46  soft-tissue]
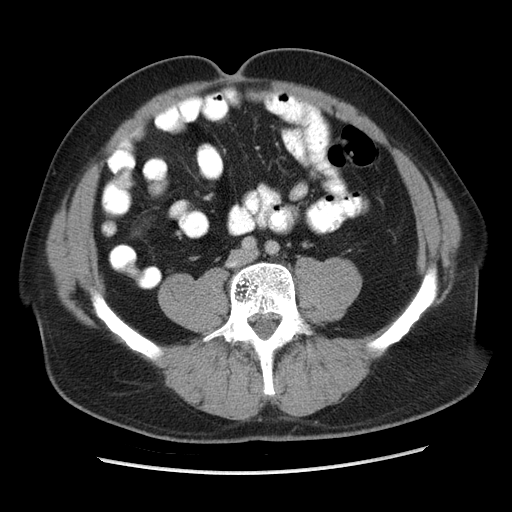
[im 39/46  lung]
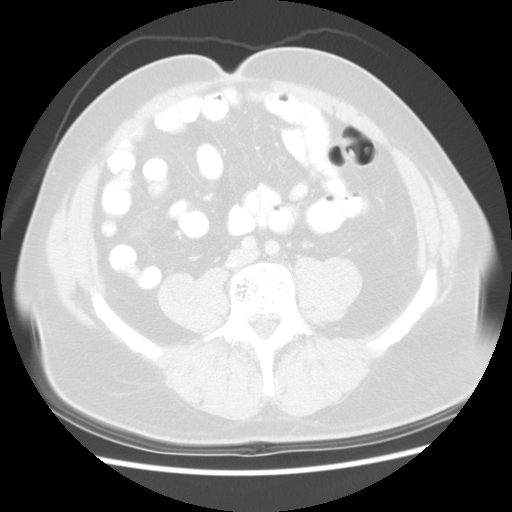

[Series 601: coronal body · coronal · 0.70mm/px · 1 of 122 slices shown, 2 images]
[im 41/122  soft-tissue]
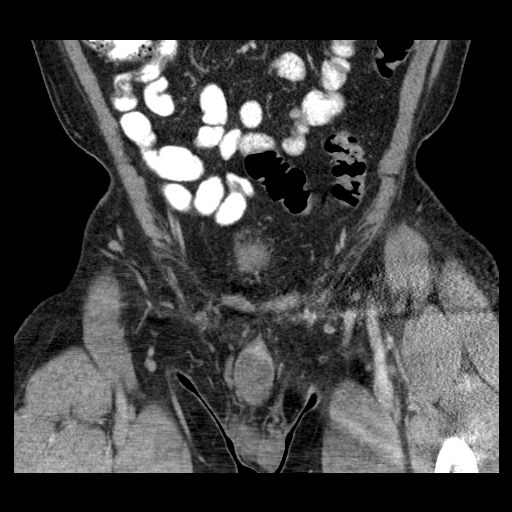
[im 41/122  bone]
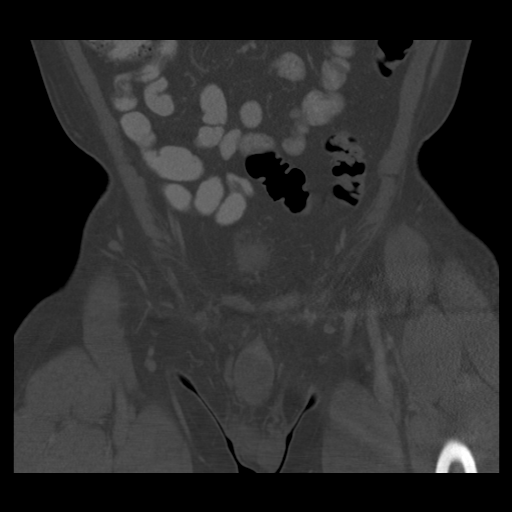

[Series 602: sagittal body · sagittal · 0.70mm/px · 6 of 145 slices shown]
[im 13/145  soft-tissue]
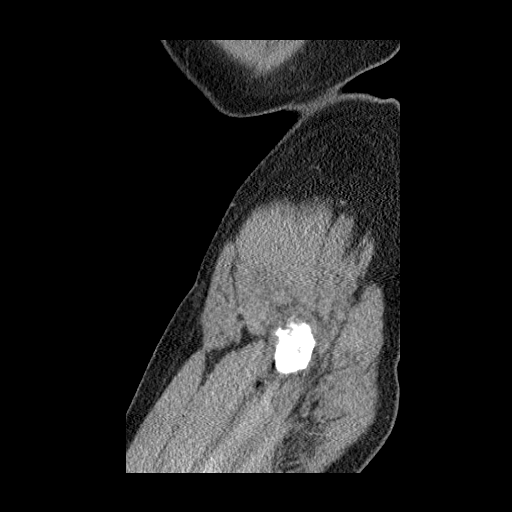
[im 32/145  soft-tissue]
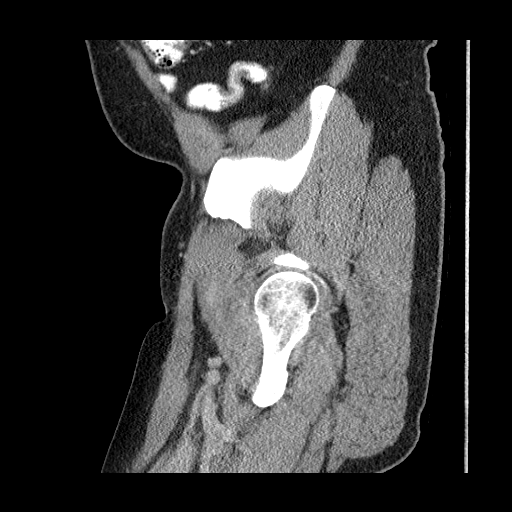
[im 44/145  soft-tissue]
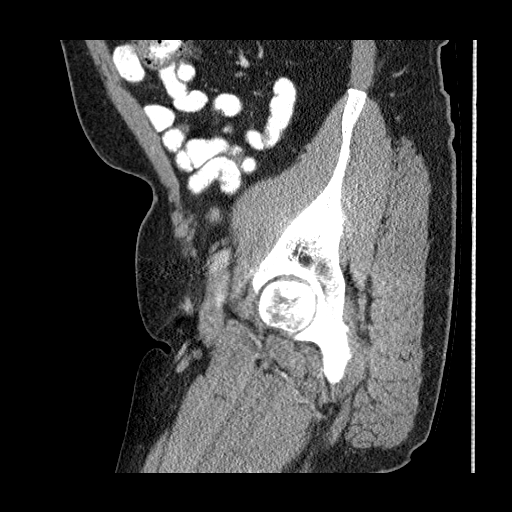
[im 63/145  soft-tissue]
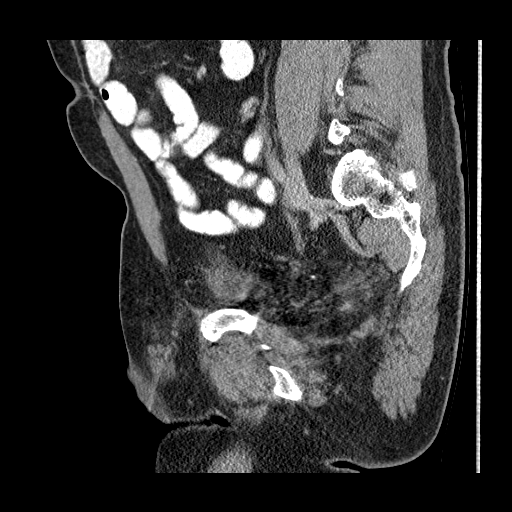
[im 82/145  soft-tissue]
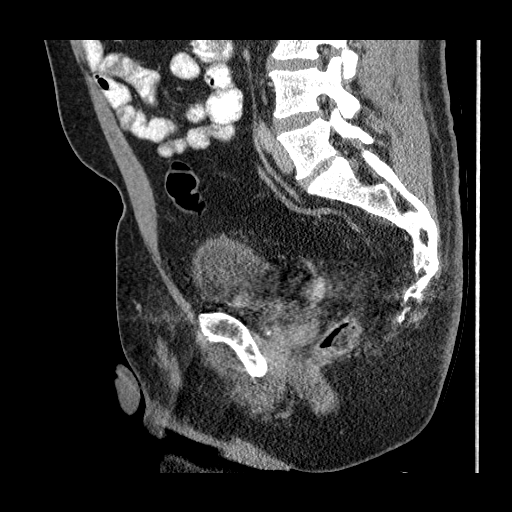
[im 101/145  soft-tissue]
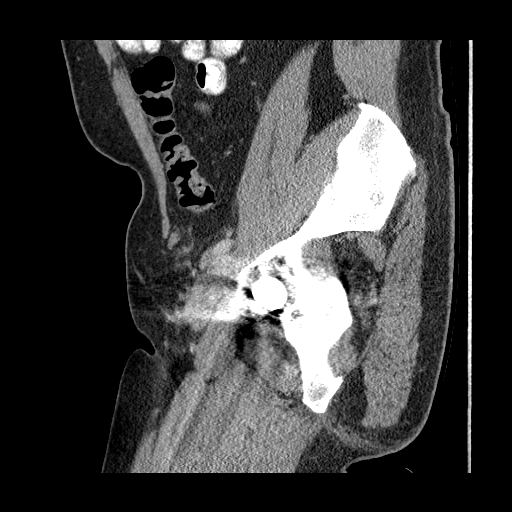

[13 of 36 positions shown; findings below may reference images not displayed]

FINDINGS: Mild to moderate beam hardening artifact from left hip
arthroplasty.

Normal pelvic bowel loops. No pelvic adenopathy.  A tiny fat
containing right inguinal hernia, felt to be new since 04/26/2002
MRI.  No bowel involvement.  Example coronal image 37/series 601.

Normal urinary bladder.  Mild prostatomegaly. No significant free
fluid.  Right-sided hemangioma at L4.
IMPRESSION: 1.  Fat containing right inguinal hernia.
2.  Mild degradation secondary to beam hardening artifact from left
hip arthroplasty.

## 2015-01-02 ENCOUNTER — Encounter (HOSPITAL_COMMUNITY): Payer: Self-pay

## 2015-01-02 ENCOUNTER — Emergency Department (HOSPITAL_COMMUNITY)
Admission: EM | Admit: 2015-01-02 | Discharge: 2015-01-02 | Disposition: A | Discharge status: Home / Self Care | Payer: No Typology Code available for payment source | Attending: Emergency Medicine | Admitting: Emergency Medicine

## 2015-01-02 DIAGNOSIS — Z86010 Personal history of colonic polyps: Secondary | ICD-10-CM | POA: Insufficient documentation

## 2015-01-02 DIAGNOSIS — M160 Bilateral primary osteoarthritis of hip: Secondary | ICD-10-CM | POA: Insufficient documentation

## 2015-01-02 DIAGNOSIS — Z8719 Personal history of other diseases of the digestive system: Secondary | ICD-10-CM | POA: Diagnosis not present

## 2015-01-02 DIAGNOSIS — S4991XA Unspecified injury of right shoulder and upper arm, initial encounter: Secondary | ICD-10-CM | POA: Insufficient documentation

## 2015-01-02 DIAGNOSIS — Y9389 Activity, other specified: Secondary | ICD-10-CM | POA: Insufficient documentation

## 2015-01-02 DIAGNOSIS — S0990XA Unspecified injury of head, initial encounter: Secondary | ICD-10-CM | POA: Diagnosis not present

## 2015-01-02 DIAGNOSIS — S4992XA Unspecified injury of left shoulder and upper arm, initial encounter: Secondary | ICD-10-CM | POA: Diagnosis not present

## 2015-01-02 DIAGNOSIS — Y998 Other external cause status: Secondary | ICD-10-CM | POA: Diagnosis not present

## 2015-01-02 DIAGNOSIS — Y92410 Unspecified street and highway as the place of occurrence of the external cause: Secondary | ICD-10-CM | POA: Diagnosis not present

## 2015-01-02 DIAGNOSIS — M791 Myalgia, unspecified site: Secondary | ICD-10-CM

## 2015-01-02 DIAGNOSIS — Z8639 Personal history of other endocrine, nutritional and metabolic disease: Secondary | ICD-10-CM | POA: Diagnosis not present

## 2015-01-02 MED ORDER — NAPROXEN 500 MG PO TABS: 500.0000 mg | ORAL_TABLET | Freq: Two times a day (BID) | ORAL | Status: AC | PRN

## 2015-01-02 MED ORDER — CYCLOBENZAPRINE HCL 10 MG PO TABS: 10.0000 mg | ORAL_TABLET | Freq: Three times a day (TID) | ORAL | Status: AC | PRN
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
## 2015-01-02 NOTE — ED Notes (Signed)
Patient was educated not to drive, operate heavy machinery, or drink alcohol while taking muscle relaxing medication.
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
## 2015-02-16 ENCOUNTER — Encounter: Payer: Self-pay | Admitting: *Deleted

## 2015-03-14 ENCOUNTER — Encounter: Payer: Self-pay | Admitting: Internal Medicine

## 2023-07-28 DEATH — deceased
